# Patient Record
Sex: Male | Born: 1950 | Race: Black or African American | Hispanic: No | Marital: Single
Health system: Southern US, Community
[De-identification: ages and names within clinical notes are randomized; demographics above are authoritative.]

## PROBLEM LIST (undated history)

## (undated) DIAGNOSIS — J449 Chronic obstructive pulmonary disease, unspecified: Secondary | ICD-10-CM

---

## 2009-02-03 ENCOUNTER — Inpatient Hospital Stay (HOSPITAL_COMMUNITY): Admission: EM | Admit: 2009-02-03 | Discharge: 2009-02-15 | Payer: Self-pay | Admitting: Emergency Medicine

## 2009-02-03 ENCOUNTER — Ambulatory Visit: Payer: Self-pay | Admitting: Emergency Medicine

## 2009-02-03 ENCOUNTER — Ambulatory Visit: Payer: Self-pay | Admitting: Cardiology

## 2009-02-04 ENCOUNTER — Ambulatory Visit: Payer: Self-pay | Admitting: Vascular Surgery

## 2009-02-04 ENCOUNTER — Encounter: Payer: Self-pay | Admitting: Emergency Medicine

## 2009-02-04 ENCOUNTER — Encounter (INDEPENDENT_AMBULATORY_CARE_PROVIDER_SITE_OTHER): Payer: Self-pay | Admitting: General Surgery

## 2009-02-11 ENCOUNTER — Ambulatory Visit: Payer: Self-pay | Admitting: Physical Medicine & Rehabilitation

## 2009-02-16 ENCOUNTER — Inpatient Hospital Stay (HOSPITAL_COMMUNITY): Admission: EM | Admit: 2009-02-16 | Discharge: 2009-03-01 | Payer: Self-pay | Admitting: Emergency Medicine

## 2009-02-16 ENCOUNTER — Ambulatory Visit: Payer: Self-pay | Admitting: Cardiovascular Disease

## 2009-02-19 ENCOUNTER — Encounter (INDEPENDENT_AMBULATORY_CARE_PROVIDER_SITE_OTHER): Payer: Self-pay | Admitting: Internal Medicine

## 2009-02-19 ENCOUNTER — Encounter: Payer: Self-pay | Admitting: Internal Medicine

## 2009-04-05 ENCOUNTER — Encounter: Payer: Self-pay | Admitting: Cardiology

## 2009-04-08 ENCOUNTER — Ambulatory Visit: Payer: Self-pay | Admitting: Cardiology

## 2009-04-08 ENCOUNTER — Encounter (INDEPENDENT_AMBULATORY_CARE_PROVIDER_SITE_OTHER): Payer: Self-pay | Admitting: *Deleted

## 2009-04-08 ENCOUNTER — Ambulatory Visit: Payer: Self-pay | Admitting: Surgery

## 2009-04-08 DIAGNOSIS — I10 Essential (primary) hypertension: Secondary | ICD-10-CM

## 2009-04-08 DIAGNOSIS — E785 Hyperlipidemia, unspecified: Secondary | ICD-10-CM | POA: Insufficient documentation

## 2009-04-08 DIAGNOSIS — I2510 Atherosclerotic heart disease of native coronary artery without angina pectoris: Secondary | ICD-10-CM | POA: Insufficient documentation

## 2009-04-08 DIAGNOSIS — I5022 Chronic systolic (congestive) heart failure: Secondary | ICD-10-CM

## 2009-04-18 LAB — CONVERTED CEMR LAB
BUN: 35 mg/dL — ABNORMAL HIGH (ref 6–23)
Basophils Absolute: 0.1 10*3/uL (ref 0.0–0.1)
Calcium: 9 mg/dL (ref 8.4–10.5)
Chloride: 101 meq/L (ref 96–112)
Creatinine, Ser: 1.7 mg/dL — ABNORMAL HIGH (ref 0.4–1.5)
HCT: 40.9 % (ref 39.0–52.0)
Lymphs Abs: 3 10*3/uL (ref 0.7–4.0)
Monocytes Absolute: 0.6 10*3/uL (ref 0.1–1.0)
Monocytes Relative: 6.3 % (ref 3.0–12.0)
Platelets: 209 10*3/uL (ref 150.0–400.0)
Pro B Natriuretic peptide (BNP): 108 pg/mL — ABNORMAL HIGH (ref 0.0–100.0)
RDW: 18.3 % — ABNORMAL HIGH (ref 11.5–14.6)

## 2009-06-04 ENCOUNTER — Encounter: Payer: Self-pay | Admitting: Cardiology

## 2009-06-10 ENCOUNTER — Ambulatory Visit: Payer: Self-pay | Admitting: Surgery

## 2009-07-16 ENCOUNTER — Ambulatory Visit: Payer: Self-pay | Admitting: Vascular Surgery

## 2009-07-25 ENCOUNTER — Encounter (INDEPENDENT_AMBULATORY_CARE_PROVIDER_SITE_OTHER): Payer: Self-pay | Admitting: *Deleted

## 2009-07-29 ENCOUNTER — Ambulatory Visit: Payer: Self-pay | Admitting: Vascular Surgery

## 2009-08-05 ENCOUNTER — Ambulatory Visit: Payer: Self-pay | Admitting: Vascular Surgery

## 2009-09-02 ENCOUNTER — Ambulatory Visit: Payer: Self-pay | Admitting: Vascular Surgery

## 2009-09-09 ENCOUNTER — Ambulatory Visit: Payer: Self-pay | Admitting: Vascular Surgery

## 2010-03-25 NOTE — Letter (Signed)
Summary: Med List  Med List   Imported By: Marylou Mccoy 06/06/2009 16:58:31  _____________________________________________________________________  External Attachment:    Type:   Image     Comment:   External Document

## 2010-03-25 NOTE — Letter (Signed)
Summary: [facility] - Disability Determination S  [facility] [facility] - Disability Determination Services   Imported By: Marylou Mccoy 08/13/2009 11:32:37  _____________________________________________________________________  External Attachment:    Type:   Image     Comment:   External Document

## 2010-03-25 NOTE — Letter (Signed)
Summary: Generic Letter  Architectural technologist, Main Office  [address]   [place], [place] [address]   Phone: [phone]  Fax: [phone]        April 08, 2009 MRN: [id number]    IBAN UTZ [address] [place], [place]  [address]    To Whom It May Concern:  Mr. Borg may perform light duty at [employer]. No lifting greater than 15-20 lbs. No climbing or stooping (activities like sweeping are ok). If you have any questions, please contact us at [phone].         Sincerely,  Charlies Constable, MD Sherri Rad, RN, BSN  This letter has been electronically signed by your physician.

## 2010-03-25 NOTE — Assessment & Plan Note (Signed)
Summary: eph per night message/lg   Visit Type:  Initial Consult-eph Primary Provider:  none-  CC:  sob and chest pain.  History of Present Illness: The patient is 60 years old and returned for management of CAD and CHF. He is a Naval architect. [place] He was admitted on December 10 with cellulitis and congestive heart failure. While in the hospital he had a DMI with ST elevation which resolved and he suddenly had catheterization and a bare metal stent placed to the distal right coronary. His permanent wedge pressure was 34 at the time of catheterization this point we are pressure was 71/35 with a mean of 51. His ejection fraction was 40%. He finally improved and was discharged to a nursing home where he had the therapy. He was discharged from the nursing home to the [facility] about 5 days ago. This is a house for homeless veterans. His son lives in [place] but apparently he's not planning to live with his son.  He has done fairly well since his discharge from the hospital but no shortness of breath or chest pain. He is about ready to run out of his medicines but he plans to go to the [facility] next week where he held to obtain his medicines. He also hopes to obtain VA benefits of that he can one daily for several house and pay rent in a private apartment.  Current Medications (verified): 1)  Amlodipine Besylate 10 Mg Tabs (Amlodipine Besylate) .... Take One Tablet By Mouth Daily 2)  Aspirin Ec 325 Mg Tbec (Aspirin) .... Take One Tablet By Mouth Daily 3)  Benazepril Hcl 5 Mg Tabs (Benazepril Hcl) .Marland Kitchen.. 1 Tab Once Daily 4)  Coreg 6.25 Mg Tabs (Carvedilol) .Marland Kitchen.. 1 Tab Two Times A Day 5)  Plavix 75 Mg Tabs (Clopidogrel Bisulfate) .Marland Kitchen.. 1 Tab Once Daily 6)  Lasix 80 Mg Tabs (Furosemide) .... One Tab By Mouth Two Times A Day 7)  Simvastatin 80 Mg Tabs (Simvastatin) .... Take One Tablet By Mouth Daily At Bedtime 8)  Roxicodone 5 Mg Tabs (Oxycodone Hcl) .Marland Kitchen.. 1 Tab Once  Daily As Needed- Shoulder 9)  Glipizide 10 Mg Xr24h-Tab (Glipizide) .Marland Kitchen.. 1 Tab Once Daily 10)  Terbinafine Hcl 250 Mg Tabs (Terbinafine Hcl) .... (Lamsil) As Directed 11)  Nitrostat 0.4 Mg Subl (Nitroglycerin) .Marland Kitchen.. 1 Tablet Under Tongue At Onset of Chest Pain; You May Repeat Every 5 Minutes For Up To 3 Doses.  Allergies (verified): No Known Drug Allergies  Past History:  Past Medical History: hypertension Hyperlipidemia Diabetes Chronic renal insufficiency with creatinines in the range of 1.5 Systolic heart failure CAD status post stenting of the right coronary following a non-S. elevation MI 12/10 Number history of cellulitis of the lower extremities  Review of Systems       ROS is negative except as outlined in HPI.   Vital Signs:  Patient profile:   61 year old male Height:      69 inches Weight:      218 pounds BMI:     32.31 BP sitting:   127 / 73  (left arm) Cuff size:   large  Vitals Entered By: Burnett Kanaris, CNA (April 08, 2009 3:36 PM)  Physical Exam  Additional Exam:  Gen. Well-nourished, in no distress   Neck: No JVD, thyroid not enlarged, no carotid bruits Lungs: No tachypnea, clear without rales, rhonchi or wheezes Cardiovascular: Rhythm regular, PMI not displaced,  heart sounds  normal, no  murmurs or gallops, no peripheral edema, pulses normal in all 4 extremities. Abdomen: BS normal, abdomen soft and non-tender without masses or organomegaly, no hepatosplenomegaly. MS: No deformities, no cyanosis or clubbing   Neuro:  No focal sns   Skin:  no lesions    Impression & Recommendations:  Problem # 1:  SYSTOLIC HEART FAILURE, CHRONIC (ICD-428.22)  He was recently hospitalized for systolic heart failure with an ejection fraction of 40% and severe pulmonary hypertension. He appears much better and appears euvolemic today. We will continue current medications. His updated medication list for this problem includes:    Amlodipine Besylate 10 Mg Tabs  (Amlodipine besylate) .Marland Kitchen... Take one tablet by mouth daily    Aspirin Ec 325 Mg Tbec (Aspirin) .Marland Kitchen... Take one tablet by mouth daily    Benazepril Hcl 5 Mg Tabs (Benazepril hcl) .Marland Kitchen... 1 tab once daily    Coreg 6.25 Mg Tabs (Carvedilol) .Marland Kitchen... 1 tab two times a day    Plavix 75 Mg Tabs (Clopidogrel bisulfate) .Marland Kitchen... 1 tab once daily    Lasix 80 Mg Tabs (Furosemide) ..... One tab by mouth two times a day    Nitrostat 0.4 Mg Subl (Nitroglycerin) .Marland Kitchen... 1 tablet under tongue at onset of chest pain; you may repeat every 5 minutes for up to 3 doses.  His updated medication list for this problem includes:    Amlodipine Besylate 10 Mg Tabs (Amlodipine besylate) .Marland Kitchen... Take one tablet by mouth daily    Aspirin Ec 325 Mg Tbec (Aspirin) .Marland Kitchen... Take one tablet by mouth daily    Benazepril Hcl 5 Mg Tabs (Benazepril hcl) .Marland Kitchen... 1 tab once daily    Coreg 6.25 Mg Tabs (Carvedilol) .Marland Kitchen... 1 tab two times a day    Plavix 75 Mg Tabs (Clopidogrel bisulfate) .Marland Kitchen... 1 tab once daily    Lasix 80 Mg Tabs (Furosemide) ..... One tab by mouth two times a day    Nitrostat 0.4 Mg Subl (Nitroglycerin) .Marland Kitchen... 1 tablet under tongue at onset of chest pain; you may repeat every 5 minutes for up to 3 doses.  Orders: TLB-BMP (Basic Metabolic Panel-BMET) (80048-METABOL) TLB-BNP (B-Natriuretic Peptide) (83880-BNPR) TLB-CBC Platelet - w/Differential (85025-CBCD) EKG w/ Interpretation (93000)  Problem # 2:  CAD, NATIVE VESSEL (ICD-414.01)  He had a DMI with transient ST elevation which resolved and he later had a bare-metal stent placed to the distal right coronary. He is had no recurrent chest pain in this problem appears stable. His updated medication list for this problem includes:    Amlodipine Besylate 10 Mg Tabs (Amlodipine besylate) .Marland Kitchen... Take one tablet by mouth daily    Aspirin Ec 325 Mg Tbec (Aspirin) .Marland Kitchen... Take one tablet by mouth daily    Benazepril Hcl 5 Mg Tabs (Benazepril hcl) .Marland Kitchen... 1 tab once daily    Coreg 6.25 Mg Tabs  (Carvedilol) .Marland Kitchen... 1 tab two times a day    Plavix 75 Mg Tabs (Clopidogrel bisulfate) .Marland Kitchen... 1 tab once daily    Nitrostat 0.4 Mg Subl (Nitroglycerin) .Marland Kitchen... 1 tablet under tongue at onset of chest pain; you may repeat every 5 minutes for up to 3 doses.  Orders: TLB-BMP (Basic Metabolic Panel-BMET) (80048-METABOL) TLB-BNP (B-Natriuretic Peptide) (83880-BNPR) TLB-CBC Platelet - w/Differential (85025-CBCD) EKG w/ Interpretation (93000)  Problem # 3:  HYPERTENSION, BENIGN (ICD-401.1) This appears controlled on current medications. His updated medication list for this problem includes:    Amlodipine Besylate 10 Mg Tabs (Amlodipine besylate) .Marland Kitchen... Take one tablet by mouth daily    Aspirin  Ec 325 Mg Tbec (Aspirin) .Marland Kitchen... Take one tablet by mouth daily    Benazepril Hcl 5 Mg Tabs (Benazepril hcl) .Marland Kitchen... 1 tab once daily    Coreg 6.25 Mg Tabs (Carvedilol) .Marland Kitchen... 1 tab two times a day    Lasix 80 Mg Tabs (Furosemide) ..... One tab by mouth two times a day  His updated medication list for this problem includes:    Amlodipine Besylate 10 Mg Tabs (Amlodipine besylate) .Marland Kitchen... Take one tablet by mouth daily    Aspirin Ec 325 Mg Tbec (Aspirin) .Marland Kitchen... Take one tablet by mouth daily    Benazepril Hcl 5 Mg Tabs (Benazepril hcl) .Marland Kitchen... 1 tab once daily    Coreg 6.25 Mg Tabs (Carvedilol) .Marland Kitchen... 1 tab two times a day    Lasix 80 Mg Tabs (Furosemide) ..... One tab by mouth two times a day  Problem # 4:  HYPERLIPIDEMIA-MIXED (ICD-272.4) This is currently being managed with simvastatin. His updated medication list for this problem includes:    Simvastatin 80 Mg Tabs (Simvastatin) .Marland Kitchen... Take one tablet by mouth daily at bedtime  His updated medication list for this problem includes:    Simvastatin 80 Mg Tabs (Simvastatin) .Marland Kitchen... Take one tablet by mouth daily at bedtime  Patient Instructions: 1)  Your physician recommends that you schedule a follow-up appointment in: 3 months. 2)  You have been given 90-day  prescriptions for all of your cardiac medications to take to the [place]. 3)  You may perform light duty at the [facility]. No lifting greater than 15-20 lbs. No climbing or stooping (things like sweeping are ok). 4)  Your physician recommends that you have lab work today: bmet/bnp/cbc (428.0;414.01) Prescriptions: NITROSTAT 0.4 MG SUBL (NITROGLYCERIN) 1 tablet under tongue at onset of chest pain; you may repeat every 5 minutes for up to 3 doses.  #25 x 3   Entered by:   Sherri Rad, RN, BSN   Authorized by:   Lenoria Farrier, MD, [facility]   Signed by:   Sherri Rad, RN, BSN on 04/08/2009   Method used:   Print then Give to Patient   RxID:   [id number] SIMVASTATIN 80 MG TABS (SIMVASTATIN) Take one tablet by mouth daily at bedtime  #90 x 3   Entered by:   Sherri Rad, RN, BSN   Authorized by:   Lenoria Farrier, MD, [facility]   Signed by:   Sherri Rad, RN, BSN on 04/08/2009   Method used:   Print then Give to Patient   RxID:   [id number] LASIX 80 MG TABS (FUROSEMIDE) one tab by mouth two times a day  #180 x 3   Entered by:   Sherri Rad, RN, BSN   Authorized by:   Lenoria Farrier, MD, [facility]   Signed by:   Sherri Rad, RN, BSN on 04/08/2009   Method used:   Print then Give to Patient   RxID:   [id number] PLAVIX 75 MG TABS (CLOPIDOGREL BISULFATE) 1 tab once daily  #90 x 3   Entered by:   Sherri Rad, RN, BSN   Authorized by:   Lenoria Farrier, MD, [facility]   Signed by:   Sherri Rad, RN, BSN on 04/08/2009   Method used:   Print then Give to Patient   RxID:   [id number] COREG 6.25 MG TABS (CARVEDILOL) 1 tab two times a day  #180 x 3   Entered by:   Sherri Rad, RN, BSN   Authorized by:   Hart Rochester  Juanda Chance, MD, [facility]   Signed by:   Sherri Rad, RN, BSN on 04/08/2009   Method used:   Print then Give to Patient   RxID:   [id number] BENAZEPRIL HCL 5 MG TABS (BENAZEPRIL HCL) 1 tab once daily  #90 x 3   Entered by:   Sherri Rad, RN, BSN   Authorized by:   Lenoria Farrier, MD, [facility]   Signed by:   Sherri Rad, RN, BSN on 04/08/2009   Method used:   Print then Give to Patient   RxID:   [id number] AMLODIPINE BESYLATE 10 MG TABS (AMLODIPINE BESYLATE) Take one tablet by mouth daily  #90 x 3   Entered by:   Sherri Rad, RN, BSN   Authorized by:   Lenoria Farrier, MD, [facility]   Signed by:   Sherri Rad, RN, BSN on 04/08/2009   Method used:   Print then Give to Patient   RxID:   [id number]

## 2010-03-25 NOTE — Consult Note (Signed)
Summary: [facility]   Imported By: Roderic Ovens 03/06/2009 16:15:11  _____________________________________________________________________  External Attachment:    Type:   Image     Comment:   External Document

## 2010-03-25 NOTE — Letter (Signed)
Summary: Appointment - Missed  [facility], Main Office  [address]   [place], [place] [address]   Phone: [phone]  Fax: [phone]     July 25, 2009 MRN: [id number]   BENNETT RAM [address] [place], [place]  [address]   Dear Mr. Mesmer,  Our records indicate you missed your appointment on 07/02/2009 with Dr. Juanda Chance. It is very important that we reach you to reschedule this appointment. We look forward to participating in your health care needs. Please contact us at the number listed above at your earliest convenience to reschedule this appointment.     Sincerely, Neurosurgeon Team LG

## 2010-05-11 LAB — BASIC METABOLIC PANEL
BUN: 25 mg/dL — ABNORMAL HIGH (ref 6–23)
BUN: 27 mg/dL — ABNORMAL HIGH (ref 6–23)
BUN: 30 mg/dL — ABNORMAL HIGH (ref 6–23)
CO2: 35 mEq/L — ABNORMAL HIGH (ref 19–32)
Chloride: 98 mEq/L (ref 96–112)
Chloride: 99 mEq/L (ref 96–112)
Creatinine, Ser: 1.44 mg/dL (ref 0.4–1.5)
GFR calc Af Amer: 56 mL/min — ABNORMAL LOW (ref >60)
GFR calc Af Amer: >60 mL/min (ref >60)
GFR calc non Af Amer: 40 mL/min — ABNORMAL LOW (ref >60)
GFR calc non Af Amer: 46 mL/min — ABNORMAL LOW (ref >60)
GFR calc non Af Amer: 50 mL/min — ABNORMAL LOW (ref >60)
Glucose, Bld: 211 mg/dL — ABNORMAL HIGH (ref 70–99)
Potassium: 4 mEq/L (ref 3.5–5.1)
Potassium: 4.3 mEq/L (ref 3.5–5.1)
Sodium: 138 mEq/L (ref 135–145)

## 2010-05-11 LAB — GLUCOSE, CAPILLARY
Glucose-Capillary: 109 mg/dL — ABNORMAL HIGH (ref 70–99)
Glucose-Capillary: 117 mg/dL — ABNORMAL HIGH (ref 70–99)
Glucose-Capillary: 126 mg/dL — ABNORMAL HIGH (ref 70–99)
Glucose-Capillary: 128 mg/dL — ABNORMAL HIGH (ref 70–99)
Glucose-Capillary: 131 mg/dL — ABNORMAL HIGH (ref 70–99)
Glucose-Capillary: 134 mg/dL — ABNORMAL HIGH (ref 70–99)
Glucose-Capillary: 140 mg/dL — ABNORMAL HIGH (ref 70–99)
Glucose-Capillary: 141 mg/dL — ABNORMAL HIGH (ref 70–99)
Glucose-Capillary: 144 mg/dL — ABNORMAL HIGH (ref 70–99)
Glucose-Capillary: 152 mg/dL — ABNORMAL HIGH (ref 70–99)
Glucose-Capillary: 157 mg/dL — ABNORMAL HIGH (ref 70–99)
Glucose-Capillary: 162 mg/dL — ABNORMAL HIGH (ref 70–99)
Glucose-Capillary: 166 mg/dL — ABNORMAL HIGH (ref 70–99)

## 2010-05-26 LAB — URINE MICROSCOPIC-ADD ON

## 2010-05-26 LAB — BASIC METABOLIC PANEL
BUN: 17 mg/dL (ref 6–23)
BUN: 20 mg/dL (ref 6–23)
BUN: 24 mg/dL — ABNORMAL HIGH (ref 6–23)
BUN: 27 mg/dL — ABNORMAL HIGH (ref 6–23)
BUN: 49 mg/dL — ABNORMAL HIGH (ref 6–23)
CO2: 29 mEq/L (ref 19–32)
CO2: 32 mEq/L (ref 19–32)
CO2: 34 mEq/L — ABNORMAL HIGH (ref 19–32)
CO2: 35 mEq/L — ABNORMAL HIGH (ref 19–32)
CO2: 36 mEq/L — ABNORMAL HIGH (ref 19–32)
CO2: 36 mEq/L — ABNORMAL HIGH (ref 19–32)
Calcium: 7.7 mg/dL — ABNORMAL LOW (ref 8.4–10.5)
Calcium: 8 mg/dL — ABNORMAL LOW (ref 8.4–10.5)
Calcium: 8.1 mg/dL — ABNORMAL LOW (ref 8.4–10.5)
Calcium: 8.1 mg/dL — ABNORMAL LOW (ref 8.4–10.5)
Calcium: 8.3 mg/dL — ABNORMAL LOW (ref 8.4–10.5)
Chloride: 104 mEq/L (ref 96–112)
Chloride: 108 mEq/L (ref 96–112)
Chloride: 112 mEq/L (ref 96–112)
Chloride: 96 mEq/L (ref 96–112)
Chloride: 98 mEq/L (ref 96–112)
Creatinine, Ser: 1.45 mg/dL (ref 0.4–1.5)
Creatinine, Ser: 1.57 mg/dL — ABNORMAL HIGH (ref 0.4–1.5)
Creatinine, Ser: 2.13 mg/dL — ABNORMAL HIGH (ref 0.4–1.5)
Creatinine, Ser: 2.84 mg/dL — ABNORMAL HIGH (ref 0.4–1.5)
GFR calc Af Amer: 28 mL/min — ABNORMAL LOW (ref >60)
GFR calc Af Amer: 39 mL/min — ABNORMAL LOW (ref >60)
GFR calc Af Amer: 50 mL/min — ABNORMAL LOW (ref >60)
GFR calc Af Amer: 55 mL/min — ABNORMAL LOW (ref >60)
GFR calc Af Amer: 56 mL/min — ABNORMAL LOW (ref >60)
GFR calc Af Amer: 57 mL/min — ABNORMAL LOW (ref >60)
GFR calc Af Amer: 57 mL/min — ABNORMAL LOW (ref >60)
GFR calc Af Amer: 58 mL/min — ABNORMAL LOW (ref >60)
GFR calc Af Amer: 60 mL/min — ABNORMAL LOW (ref >60)
GFR calc Af Amer: >60 mL/min (ref >60)
GFR calc non Af Amer: 41 mL/min — ABNORMAL LOW (ref >60)
GFR calc non Af Amer: 46 mL/min — ABNORMAL LOW (ref >60)
GFR calc non Af Amer: 47 mL/min — ABNORMAL LOW (ref >60)
GFR calc non Af Amer: 47 mL/min — ABNORMAL LOW (ref >60)
GFR calc non Af Amer: 48 mL/min — ABNORMAL LOW (ref >60)
GFR calc non Af Amer: 57 mL/min — ABNORMAL LOW (ref >60)
Glucose, Bld: 110 mg/dL — ABNORMAL HIGH (ref 70–99)
Glucose, Bld: 152 mg/dL — ABNORMAL HIGH (ref 70–99)
Glucose, Bld: 186 mg/dL — ABNORMAL HIGH (ref 70–99)
Glucose, Bld: 216 mg/dL — ABNORMAL HIGH (ref 70–99)
Potassium: 3.4 mEq/L — ABNORMAL LOW (ref 3.5–5.1)
Potassium: 3.6 mEq/L (ref 3.5–5.1)
Potassium: 3.6 mEq/L (ref 3.5–5.1)
Potassium: 4.2 mEq/L (ref 3.5–5.1)
Potassium: 4.2 mEq/L (ref 3.5–5.1)
Potassium: 4.2 mEq/L (ref 3.5–5.1)
Potassium: 4.2 mEq/L (ref 3.5–5.1)
Potassium: 4.3 mEq/L (ref 3.5–5.1)
Potassium: 4.4 mEq/L (ref 3.5–5.1)
Sodium: 137 mEq/L (ref 135–145)
Sodium: 137 mEq/L (ref 135–145)
Sodium: 139 mEq/L (ref 135–145)
Sodium: 139 mEq/L (ref 135–145)
Sodium: 144 mEq/L (ref 135–145)
Sodium: 150 mEq/L — ABNORMAL HIGH (ref 135–145)
Sodium: 151 mEq/L — ABNORMAL HIGH (ref 135–145)
Sodium: 151 mEq/L — ABNORMAL HIGH (ref 135–145)

## 2010-05-26 LAB — CBC
HCT: 38.4 % — ABNORMAL LOW (ref 39.0–52.0)
HCT: 41 % (ref 39.0–52.0)
HCT: 41.2 % (ref 39.0–52.0)
HCT: 42.4 % (ref 39.0–52.0)
HCT: 43.4 % (ref 39.0–52.0)
HCT: 43.4 % (ref 39.0–52.0)
HCT: 44 % (ref 39.0–52.0)
Hemoglobin: 11.9 g/dL — ABNORMAL LOW (ref 13.0–17.0)
Hemoglobin: 12.4 g/dL — ABNORMAL LOW (ref 13.0–17.0)
Hemoglobin: 13.1 g/dL (ref 13.0–17.0)
Hemoglobin: 13.3 g/dL (ref 13.0–17.0)
Hemoglobin: 13.6 g/dL (ref 13.0–17.0)
Hemoglobin: 13.9 g/dL (ref 13.0–17.0)
MCHC: 31.1 g/dL (ref 30.0–36.0)
MCHC: 31.2 g/dL (ref 30.0–36.0)
MCHC: 31.3 g/dL (ref 30.0–36.0)
MCHC: 31.8 g/dL (ref 30.0–36.0)
MCHC: 32 g/dL (ref 30.0–36.0)
MCHC: 32 g/dL (ref 30.0–36.0)
MCHC: 32.4 g/dL (ref 30.0–36.0)
MCHC: 32.5 g/dL (ref 30.0–36.0)
MCHC: 32.5 g/dL (ref 30.0–36.0)
MCV: 81.7 fL (ref 78.0–100.0)
MCV: 82.1 fL (ref 78.0–100.0)
MCV: 82.6 fL (ref 78.0–100.0)
MCV: 82.8 fL (ref 78.0–100.0)
MCV: 83.5 fL (ref 78.0–100.0)
MCV: 84 fL (ref 78.0–100.0)
MCV: 84.1 fL (ref 78.0–100.0)
Platelets: 159 10*3/uL (ref 150–400)
Platelets: 181 10*3/uL (ref 150–400)
Platelets: 183 10*3/uL (ref 150–400)
Platelets: 190 10*3/uL (ref 150–400)
Platelets: 191 10*3/uL (ref 150–400)
Platelets: 192 10*3/uL (ref 150–400)
Platelets: 198 10*3/uL (ref 150–400)
Platelets: 198 10*3/uL (ref 150–400)
Platelets: 232 10*3/uL (ref 150–400)
RBC: 4.94 MIL/uL (ref 4.22–5.81)
RBC: 5.01 MIL/uL (ref 4.22–5.81)
RBC: 5.21 MIL/uL (ref 4.22–5.81)
RBC: 5.23 MIL/uL (ref 4.22–5.81)
RBC: 5.26 MIL/uL (ref 4.22–5.81)
RBC: 5.61 MIL/uL (ref 4.22–5.81)
RBC: 5.68 MIL/uL (ref 4.22–5.81)
RDW: 19.9 % — ABNORMAL HIGH (ref 11.5–15.5)
RDW: 20 % — ABNORMAL HIGH (ref 11.5–15.5)
RDW: 20.5 % — ABNORMAL HIGH (ref 11.5–15.5)
RDW: 20.9 % — ABNORMAL HIGH (ref 11.5–15.5)
WBC: 11.1 10*3/uL — ABNORMAL HIGH (ref 4.0–10.5)
WBC: 13.3 10*3/uL — ABNORMAL HIGH (ref 4.0–10.5)
WBC: 13.7 10*3/uL — ABNORMAL HIGH (ref 4.0–10.5)
WBC: 13.7 10*3/uL — ABNORMAL HIGH (ref 4.0–10.5)
WBC: 15.9 10*3/uL — ABNORMAL HIGH (ref 4.0–10.5)
WBC: 16.5 10*3/uL — ABNORMAL HIGH (ref 4.0–10.5)
WBC: 8.9 10*3/uL (ref 4.0–10.5)

## 2010-05-26 LAB — CARDIAC PANEL(CRET KIN+CKTOT+MB+TROPI)
CK, MB: 10.9 ng/mL — ABNORMAL HIGH (ref 0.3–4.0)
CK, MB: 14.6 ng/mL — ABNORMAL HIGH (ref 0.3–4.0)
Relative Index: 13.6 — ABNORMAL HIGH (ref 0.0–2.5)
Relative Index: 15 — ABNORMAL HIGH (ref 0.0–2.5)
Relative Index: INVALID (ref 0.0–2.5)
Total CK: 107 U/L (ref 7–232)
Total CK: 47 U/L (ref 7–232)
Total CK: 83 U/L (ref 7–232)
Troponin I: 0.04 ng/mL (ref 0.00–0.06)
Troponin I: 2.55 ng/mL (ref 0.00–0.06)
Troponin I: 2.64 ng/mL (ref 0.00–0.06)

## 2010-05-26 LAB — LIPID PANEL
Cholesterol: 109 mg/dL (ref 0–200)
LDL Cholesterol: 65 mg/dL (ref 0–99)
Total CHOL/HDL Ratio: 5 RATIO
Triglycerides: 111 mg/dL (ref <150)
VLDL: 22 mg/dL (ref 0–40)

## 2010-05-26 LAB — GLUCOSE, CAPILLARY
Glucose-Capillary: 100 mg/dL — ABNORMAL HIGH (ref 70–99)
Glucose-Capillary: 100 mg/dL — ABNORMAL HIGH (ref 70–99)
Glucose-Capillary: 101 mg/dL — ABNORMAL HIGH (ref 70–99)
Glucose-Capillary: 105 mg/dL — ABNORMAL HIGH (ref 70–99)
Glucose-Capillary: 119 mg/dL — ABNORMAL HIGH (ref 70–99)
Glucose-Capillary: 127 mg/dL — ABNORMAL HIGH (ref 70–99)
Glucose-Capillary: 131 mg/dL — ABNORMAL HIGH (ref 70–99)
Glucose-Capillary: 131 mg/dL — ABNORMAL HIGH (ref 70–99)
Glucose-Capillary: 133 mg/dL — ABNORMAL HIGH (ref 70–99)
Glucose-Capillary: 135 mg/dL — ABNORMAL HIGH (ref 70–99)
Glucose-Capillary: 135 mg/dL — ABNORMAL HIGH (ref 70–99)
Glucose-Capillary: 136 mg/dL — ABNORMAL HIGH (ref 70–99)
Glucose-Capillary: 139 mg/dL — ABNORMAL HIGH (ref 70–99)
Glucose-Capillary: 141 mg/dL — ABNORMAL HIGH (ref 70–99)
Glucose-Capillary: 141 mg/dL — ABNORMAL HIGH (ref 70–99)
Glucose-Capillary: 143 mg/dL — ABNORMAL HIGH (ref 70–99)
Glucose-Capillary: 143 mg/dL — ABNORMAL HIGH (ref 70–99)
Glucose-Capillary: 146 mg/dL — ABNORMAL HIGH (ref 70–99)
Glucose-Capillary: 147 mg/dL — ABNORMAL HIGH (ref 70–99)
Glucose-Capillary: 148 mg/dL — ABNORMAL HIGH (ref 70–99)
Glucose-Capillary: 150 mg/dL — ABNORMAL HIGH (ref 70–99)
Glucose-Capillary: 150 mg/dL — ABNORMAL HIGH (ref 70–99)
Glucose-Capillary: 154 mg/dL — ABNORMAL HIGH (ref 70–99)
Glucose-Capillary: 155 mg/dL — ABNORMAL HIGH (ref 70–99)
Glucose-Capillary: 156 mg/dL — ABNORMAL HIGH (ref 70–99)
Glucose-Capillary: 157 mg/dL — ABNORMAL HIGH (ref 70–99)
Glucose-Capillary: 159 mg/dL — ABNORMAL HIGH (ref 70–99)
Glucose-Capillary: 161 mg/dL — ABNORMAL HIGH (ref 70–99)
Glucose-Capillary: 161 mg/dL — ABNORMAL HIGH (ref 70–99)
Glucose-Capillary: 162 mg/dL — ABNORMAL HIGH (ref 70–99)
Glucose-Capillary: 163 mg/dL — ABNORMAL HIGH (ref 70–99)
Glucose-Capillary: 170 mg/dL — ABNORMAL HIGH (ref 70–99)
Glucose-Capillary: 172 mg/dL — ABNORMAL HIGH (ref 70–99)
Glucose-Capillary: 180 mg/dL — ABNORMAL HIGH (ref 70–99)
Glucose-Capillary: 188 mg/dL — ABNORMAL HIGH (ref 70–99)
Glucose-Capillary: 189 mg/dL — ABNORMAL HIGH (ref 70–99)
Glucose-Capillary: 189 mg/dL — ABNORMAL HIGH (ref 70–99)
Glucose-Capillary: 203 mg/dL — ABNORMAL HIGH (ref 70–99)
Glucose-Capillary: 213 mg/dL — ABNORMAL HIGH (ref 70–99)

## 2010-05-26 LAB — COMPREHENSIVE METABOLIC PANEL
ALT: 32 U/L (ref 0–53)
AST: 19 U/L (ref 0–37)
Albumin: 1.6 g/dL — ABNORMAL LOW (ref 3.5–5.2)
BUN: 26 mg/dL — ABNORMAL HIGH (ref 6–23)
CO2: 37 mEq/L — ABNORMAL HIGH (ref 19–32)
Calcium: 8.4 mg/dL (ref 8.4–10.5)
Calcium: 8.6 mg/dL (ref 8.4–10.5)
Creatinine, Ser: 1.53 mg/dL — ABNORMAL HIGH (ref 0.4–1.5)
GFR calc Af Amer: 56 mL/min — ABNORMAL LOW (ref >60)
GFR calc non Af Amer: 52 mL/min — ABNORMAL LOW (ref >60)
Glucose, Bld: 209 mg/dL — ABNORMAL HIGH (ref 70–99)
Sodium: 138 mEq/L (ref 135–145)
Total Protein: 6.7 g/dL (ref 6.0–8.3)

## 2010-05-26 LAB — DIFFERENTIAL
Basophils Absolute: 0 10*3/uL (ref 0.0–0.1)
Eosinophils Absolute: 0.1 10*3/uL (ref 0.0–0.7)
Eosinophils Relative: 1 % (ref 0–5)
Eosinophils Relative: 2 % (ref 0–5)
Lymphocytes Relative: 6 % — ABNORMAL LOW (ref 12–46)
Lymphocytes Relative: 9 % — ABNORMAL LOW (ref 12–46)
Lymphs Abs: 1.3 10*3/uL (ref 0.7–4.0)
Monocytes Absolute: 0.4 10*3/uL (ref 0.1–1.0)
Monocytes Absolute: 0.9 10*3/uL (ref 0.1–1.0)
Monocytes Relative: 4 % (ref 3–12)
Neutro Abs: 11.3 10*3/uL — ABNORMAL HIGH (ref 1.7–7.7)
Neutro Abs: 9.9 10*3/uL — ABNORMAL HIGH (ref 1.7–7.7)
Neutrophils Relative %: 89 % — ABNORMAL HIGH (ref 43–77)
nRBC: 0 /100 WBC

## 2010-05-26 LAB — POCT I-STAT, CHEM 8
Calcium, Ion: 1.09 mmol/L — ABNORMAL LOW (ref 1.12–1.32)
Chloride: 99 mEq/L (ref 96–112)
HCT: 47 % (ref 39.0–52.0)
Hemoglobin: 16 g/dL (ref 13.0–17.0)
TCO2: 44 mmol/L (ref 0–100)

## 2010-05-26 LAB — LIPASE, BLOOD: Lipase: 16 U/L (ref 11–59)

## 2010-05-26 LAB — URINALYSIS, ROUTINE W REFLEX MICROSCOPIC
Bilirubin Urine: NEGATIVE
Glucose, UA: NEGATIVE mg/dL
Glucose, UA: NEGATIVE mg/dL
Ketones, ur: NEGATIVE mg/dL
Protein, ur: 30 mg/dL — AB
Specific Gravity, Urine: 1.012 (ref 1.005–1.030)
Urobilinogen, UA: 0.2 mg/dL (ref 0.0–1.0)
pH: 6 (ref 5.0–8.0)

## 2010-05-26 LAB — CULTURE, BLOOD (ROUTINE X 2): Culture: NO GROWTH

## 2010-05-26 LAB — POCT I-STAT 3, ART BLOOD GAS (G3+)
Acid-Base Excess: 10 mmol/L — ABNORMAL HIGH (ref 0.0–2.0)
Acid-Base Excess: 8 mmol/L — ABNORMAL HIGH (ref 0.0–2.0)
Bicarbonate: 33.2 mEq/L — ABNORMAL HIGH (ref 20.0–24.0)
Bicarbonate: 36.8 mEq/L — ABNORMAL HIGH (ref 20.0–24.0)
O2 Saturation: 97 %
Patient temperature: 98.7
TCO2: 35 mmol/L (ref 0–100)
TCO2: 38 mmol/L (ref 0–100)
pO2, Arterial: 91 mmHg (ref 80.0–100.0)

## 2010-05-26 LAB — POCT I-STAT 3, VENOUS BLOOD GAS (G3P V)
Bicarbonate: 32.7 mEq/L — ABNORMAL HIGH (ref 20.0–24.0)
TCO2: 34 mmol/L (ref 0–100)
pCO2, Ven: 55.4 mmHg — ABNORMAL HIGH (ref 45.0–50.0)
pH, Ven: 7.379 — ABNORMAL HIGH (ref 7.250–7.300)
pO2, Ven: 35 mmHg (ref 30.0–45.0)

## 2010-05-26 LAB — URINE CULTURE: Colony Count: 15000

## 2010-05-26 LAB — PROTIME-INR
INR: 1.31 (ref 0.00–1.49)
Prothrombin Time: 16.2 seconds — ABNORMAL HIGH (ref 11.6–15.2)

## 2010-05-26 LAB — MAGNESIUM: Magnesium: 1.9 mg/dL (ref 1.5–2.5)

## 2010-05-26 LAB — BRAIN NATRIURETIC PEPTIDE: Pro B Natriuretic peptide (BNP): 637 pg/mL — ABNORMAL HIGH (ref 0.0–100.0)

## 2010-05-27 LAB — URINE MICROSCOPIC-ADD ON

## 2010-05-27 LAB — POCT I-STAT 7, (LYTES, BLD GAS, ICA,H+H)
Acid-Base Excess: 1 mmol/L (ref 0.0–2.0)
Calcium, Ion: 1.06 mmol/L — ABNORMAL LOW (ref 1.12–1.32)
O2 Saturation: 94 %
Patient temperature: 37
Potassium: 5.3 mEq/L — ABNORMAL HIGH (ref 3.5–5.1)
TCO2: 34 mmol/L (ref 0–100)
pCO2 arterial: 76.4 mmHg (ref 35.0–45.0)

## 2010-05-27 LAB — CBC
HCT: 45.8 % (ref 39.0–52.0)
Hemoglobin: 13.5 g/dL (ref 13.0–17.0)
Hemoglobin: 13.7 g/dL (ref 13.0–17.0)
Hemoglobin: 14.5 g/dL (ref 13.0–17.0)
MCHC: 31.3 g/dL (ref 30.0–36.0)
MCHC: 31.5 g/dL (ref 30.0–36.0)
MCHC: 32 g/dL (ref 30.0–36.0)
MCV: 83.8 fL (ref 78.0–100.0)
MCV: 84.9 fL (ref 78.0–100.0)
Platelets: 152 10*3/uL (ref 150–400)
Platelets: 154 10*3/uL (ref 150–400)
RBC: 5 MIL/uL (ref 4.22–5.81)
RBC: 5.13 MIL/uL (ref 4.22–5.81)
RBC: 5.14 MIL/uL (ref 4.22–5.81)
RBC: 5.84 MIL/uL — ABNORMAL HIGH (ref 4.22–5.81)
RDW: 20.4 % — ABNORMAL HIGH (ref 11.5–15.5)
RDW: 20.6 % — ABNORMAL HIGH (ref 11.5–15.5)
RDW: 20.9 % — ABNORMAL HIGH (ref 11.5–15.5)
WBC: 14.3 10*3/uL — ABNORMAL HIGH (ref 4.0–10.5)
WBC: 23.2 10*3/uL — ABNORMAL HIGH (ref 4.0–10.5)

## 2010-05-27 LAB — COMPREHENSIVE METABOLIC PANEL
ALT: 16 U/L (ref 0–53)
AST: 27 U/L (ref 0–37)
Albumin: 2.3 g/dL — ABNORMAL LOW (ref 3.5–5.2)
CO2: 26 mEq/L (ref 19–32)
CO2: 28 mEq/L (ref 19–32)
Calcium: 7.2 mg/dL — ABNORMAL LOW (ref 8.4–10.5)
Calcium: 7.2 mg/dL — ABNORMAL LOW (ref 8.4–10.5)
Creatinine, Ser: 4.16 mg/dL — ABNORMAL HIGH (ref 0.4–1.5)
GFR calc Af Amer: 18 mL/min — ABNORMAL LOW (ref >60)
GFR calc Af Amer: 35 mL/min — ABNORMAL LOW (ref >60)
GFR calc non Af Amer: 15 mL/min — ABNORMAL LOW (ref >60)
Glucose, Bld: 136 mg/dL — ABNORMAL HIGH (ref 70–99)
Sodium: 132 mEq/L — ABNORMAL LOW (ref 135–145)
Total Protein: 5.8 g/dL — ABNORMAL LOW (ref 6.0–8.3)
Total Protein: 6.7 g/dL (ref 6.0–8.3)

## 2010-05-27 LAB — DIFFERENTIAL
Basophils Absolute: 0 10*3/uL (ref 0.0–0.1)
Basophils Relative: 0 % (ref 0–1)
Eosinophils Absolute: 0 10*3/uL (ref 0.0–0.7)
Eosinophils Relative: 0 % (ref 0–5)
Lymphocytes Relative: 2 % — ABNORMAL LOW (ref 12–46)
Lymphs Abs: 0.2 10*3/uL — ABNORMAL LOW (ref 0.7–4.0)
Monocytes Relative: 3 % (ref 3–12)
Monocytes Relative: 4 % (ref 3–12)
Neutro Abs: 22.3 10*3/uL — ABNORMAL HIGH (ref 1.7–7.7)
Neutrophils Relative %: 94 % — ABNORMAL HIGH (ref 43–77)

## 2010-05-27 LAB — BASIC METABOLIC PANEL
BUN: 38 mg/dL — ABNORMAL HIGH (ref 6–23)
BUN: 41 mg/dL — ABNORMAL HIGH (ref 6–23)
CO2: 25 mEq/L (ref 19–32)
CO2: 27 mEq/L (ref 19–32)
CO2: 29 mEq/L (ref 19–32)
CO2: 30 mEq/L (ref 19–32)
Calcium: 6.8 mg/dL — ABNORMAL LOW (ref 8.4–10.5)
Calcium: 7 mg/dL — ABNORMAL LOW (ref 8.4–10.5)
Chloride: 100 mEq/L (ref 96–112)
Chloride: 99 mEq/L (ref 96–112)
Creatinine, Ser: 3.78 mg/dL — ABNORMAL HIGH (ref 0.4–1.5)
GFR calc Af Amer: 20 mL/min — ABNORMAL LOW (ref >60)
GFR calc Af Amer: 22 mL/min — ABNORMAL LOW (ref >60)
GFR calc non Af Amer: 16 mL/min — ABNORMAL LOW (ref >60)
GFR calc non Af Amer: 18 mL/min — ABNORMAL LOW (ref >60)
GFR calc non Af Amer: 20 mL/min — ABNORMAL LOW (ref >60)
GFR calc non Af Amer: 22 mL/min — ABNORMAL LOW (ref >60)
Glucose, Bld: 144 mg/dL — ABNORMAL HIGH (ref 70–99)
Glucose, Bld: 168 mg/dL — ABNORMAL HIGH (ref 70–99)
Potassium: 5.2 mEq/L — ABNORMAL HIGH (ref 3.5–5.1)
Potassium: 5.6 mEq/L — ABNORMAL HIGH (ref 3.5–5.1)
Sodium: 135 mEq/L (ref 135–145)
Sodium: 135 mEq/L (ref 135–145)
Sodium: 136 mEq/L (ref 135–145)

## 2010-05-27 LAB — URINALYSIS, ROUTINE W REFLEX MICROSCOPIC
Glucose, UA: NEGATIVE mg/dL
Ketones, ur: NEGATIVE mg/dL
Protein, ur: 100 mg/dL — AB

## 2010-05-27 LAB — POCT I-STAT 3, ART BLOOD GAS (G3+)
Acid-Base Excess: 4 mmol/L — ABNORMAL HIGH (ref 0.0–2.0)
Bicarbonate: 27.3 mEq/L — ABNORMAL HIGH (ref 20.0–24.0)
Bicarbonate: 28.1 mEq/L — ABNORMAL HIGH (ref 20.0–24.0)
Bicarbonate: 28.6 mEq/L — ABNORMAL HIGH (ref 20.0–24.0)
Bicarbonate: 32.9 mEq/L — ABNORMAL HIGH (ref 20.0–24.0)
O2 Saturation: 94 %
Patient temperature: 37.5
Patient temperature: 37.9
Patient temperature: 37.9
Patient temperature: 99
Patient temperature: 99
TCO2: 29 mmol/L (ref 0–100)
TCO2: 31 mmol/L (ref 0–100)
TCO2: 33 mmol/L (ref 0–100)
TCO2: 36 mmol/L (ref 0–100)
pCO2 arterial: 48.2 mmHg — ABNORMAL HIGH (ref 35.0–45.0)
pCO2 arterial: 66.1 mmHg (ref 35.0–45.0)
pH, Arterial: 7.255 — ABNORMAL LOW (ref 7.350–7.450)
pH, Arterial: 7.353 (ref 7.350–7.450)
pH, Arterial: 7.396 (ref 7.350–7.450)
pH, Arterial: 7.396 (ref 7.350–7.450)
pH, Arterial: 7.435 (ref 7.350–7.450)
pO2, Arterial: 391 mmHg — ABNORMAL HIGH (ref 80.0–100.0)
pO2, Arterial: 73 mmHg — ABNORMAL LOW (ref 80.0–100.0)

## 2010-05-27 LAB — CULTURE, BLOOD (ROUTINE X 2)

## 2010-05-27 LAB — PHOSPHORUS
Phosphorus: 4.6 mg/dL (ref 2.3–4.6)
Phosphorus: 6 mg/dL — ABNORMAL HIGH (ref 2.3–4.6)
Phosphorus: 6.8 mg/dL — ABNORMAL HIGH (ref 2.3–4.6)

## 2010-05-27 LAB — TSH: TSH: 1.867 u[IU]/mL (ref 0.350–4.500)

## 2010-05-27 LAB — GLUCOSE, CAPILLARY
Glucose-Capillary: 101 mg/dL — ABNORMAL HIGH (ref 70–99)
Glucose-Capillary: 107 mg/dL — ABNORMAL HIGH (ref 70–99)
Glucose-Capillary: 108 mg/dL — ABNORMAL HIGH (ref 70–99)
Glucose-Capillary: 108 mg/dL — ABNORMAL HIGH (ref 70–99)
Glucose-Capillary: 109 mg/dL — ABNORMAL HIGH (ref 70–99)
Glucose-Capillary: 110 mg/dL — ABNORMAL HIGH (ref 70–99)
Glucose-Capillary: 129 mg/dL — ABNORMAL HIGH (ref 70–99)
Glucose-Capillary: 134 mg/dL — ABNORMAL HIGH (ref 70–99)
Glucose-Capillary: 142 mg/dL — ABNORMAL HIGH (ref 70–99)
Glucose-Capillary: 143 mg/dL — ABNORMAL HIGH (ref 70–99)
Glucose-Capillary: 158 mg/dL — ABNORMAL HIGH (ref 70–99)
Glucose-Capillary: 163 mg/dL — ABNORMAL HIGH (ref 70–99)
Glucose-Capillary: 89 mg/dL (ref 70–99)

## 2010-05-27 LAB — POCT CARDIAC MARKERS
CKMB, poc: 3.4 ng/mL (ref 1.0–8.0)
Myoglobin, poc: >500 ng/mL (ref 12–200)
Troponin i, poc: 0.08 ng/mL (ref 0.00–0.09)

## 2010-05-27 LAB — CARDIAC PANEL(CRET KIN+CKTOT+MB+TROPI)
Relative Index: 2.1 (ref 0.0–2.5)
Troponin I: 0.27 ng/mL — ABNORMAL HIGH (ref 0.00–0.06)

## 2010-05-27 LAB — IMMUNOFIXATION ELECTROPHORESIS
IgA: 677 mg/dL — ABNORMAL HIGH (ref 68–378)
IgG (Immunoglobin G), Serum: 1000 mg/dL (ref 694–1618)

## 2010-05-27 LAB — CARBOXYHEMOGLOBIN
Carboxyhemoglobin: 1.3 % (ref 0.5–1.5)
Methemoglobin: 1 % (ref 0.0–1.5)
O2 Saturation: 69.5 %
Total hemoglobin: 13.7 g/dL (ref 13.5–18.0)

## 2010-05-27 LAB — CK: Total CK: 219 U/L (ref 7–232)

## 2010-05-27 LAB — BLOOD GAS, VENOUS
O2 Saturation: 57.6 %
Patient temperature: 98.6
TCO2: 33.5 mmol/L (ref 0–100)
pH, Ven: 7.265 (ref 7.250–7.300)

## 2010-05-27 LAB — BRAIN NATRIURETIC PEPTIDE
Pro B Natriuretic peptide (BNP): 216 pg/mL — ABNORMAL HIGH (ref 0.0–100.0)
Pro B Natriuretic peptide (BNP): 660 pg/mL — ABNORMAL HIGH (ref 0.0–100.0)

## 2010-05-27 LAB — CORTISOL: Cortisol, Plasma: 35.5 ug/dL

## 2010-05-27 LAB — APTT: aPTT: 32 seconds (ref 24–37)

## 2010-05-27 LAB — VANCOMYCIN, RANDOM: Vancomycin Rm: 16.8 ug/mL

## 2010-05-27 LAB — PROTIME-INR
INR: 1.21 (ref 0.00–1.49)
Prothrombin Time: 15.2 seconds (ref 11.6–15.2)

## 2010-05-27 LAB — MAGNESIUM: Magnesium: 1.8 mg/dL (ref 1.5–2.5)

## 2010-07-08 NOTE — Procedures (Signed)
DUPLEX DEEP VENOUS EXAM - LOWER EXTREMITY   INDICATION:  Follow up left GSV ablation.   HISTORY:  Edema:  Mostly left lower extremity.  Trauma/Surgery:  On 07/29/2009, left greater saphenous vein ablation.  Pain:  Left lower extremity.  PE:  No.  Previous DVT:  No.  Anticoagulants:  Other:   DUPLEX EXAM:                CFV   SFV   PopV  PTV    GSV                R  L  R  L  R  L  R   L  R  L  Thrombosis    o  o     o     o      o     +  Spontaneous   +  +     +     +      +     o  Phasic        +  +     +     +      +     o  Augmentation  +  +     +     +      +     o  Compressible  +  +     +     +      +     o  Competent     o  o     o     +      +     o   Legend:  + - yes  o - no  p - partial  D - decreased   IMPRESSION:  1. No evidence of deep venous thrombosis in the left lower extremity      or right common femoral vein.  2. Evidence of ablated left greater saphenous vein with no flow from      near the saphenofemoral junction to the distal thigh, distal to      which the greater saphenous vein is open with reflux.    _____________________________  Joseph Durham. Hart Rochester, M.D.   AS/MEDQ  D:  08/05/2009  T:  08/05/2009  Job:  [id number]

## 2010-07-08 NOTE — Assessment & Plan Note (Signed)
OFFICE VISIT   ASSER, LUCENA  DOB:  1950-07-23                                       06/10/2009  ZOXWR#:[id number]   The patient comes back today for followup.  I had initially evaluated  him for his diabetes.  He was found to have significant venous  insufficiency with large varicosities.  I put him in compression  stockings brought him back today for further evaluation as well as a  reflux study.  The patient states that his symptoms of heaviness and  fatigue and swelling are much improved with compression stockings.   PHYSICAL EXAMINATION:  Heart rate 104, blood pressure 109/64,  temperature is 97.8.  generally, he is well-appearing, in no distress.  Respirations are nonlabored.  Cardiovascular is regular rate and rhythm.  Extremities reveal minimal edema.  He has varicosities in the right  greater saphenous distribution bilaterally.   DIAGNOSTIC STUDIES:  Venous insufficiency evaluation was independently  reviewed.  This shows bilateral venous insufficiency.   ASSESSMENT/PLAN:  Bilateral venous insufficiency with varicosities,   PLAN:  The patient is having good response from compression therapy.  I  think he would be an excellent candidate for laser ablation and stab  phlebectomy.  The patient is going to discuss this with Dr. Newt Lukes at  the [facility] to evaluate him for insurance approval.  I am scheduling him to  come back and see Dr. Hart Rochester in 1 month to schedule his procedure.     Jorge Ny, MD  Electronically Signed   VWB/MEDQ  D:  06/10/2009  T:  06/11/2009  Job:  [id number]   cc:   [facility] Dr. Marny Lowenstein
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
## 2010-07-08 NOTE — Assessment & Plan Note (Signed)
OFFICE VISIT   Joseph Durham, Joseph Durham  DOB:  [birth year]                                       04/08/2009  ZOXWR#:[id number]   REASON FOR VISIT:  To evaluate arterial circulation.   HISTORY:  This is a 60 year old gentleman with multiple medical problems  whom I am seeing at the request of Dr. Al Corpus for evaluation of  peripheral vascular disease.  The patient is scheduled to undergo  biopsy/  debridement of his feet and with his known history of diabetes  and vascular disease, he was sent for further evaluation.  The patient  denies having any history of claudication.  He does not have any rest  pain.  He does have some neuropathic pain in his legs.  The patient  suffers from diabetes which began at age 89 requiring insulin.  He also  has hypertension and hypercholesterolemia which has been medically  managed and he has had heart attack and suffers from congestive heart  failure.  He has been having trouble with his legs for the past 10 years  and it has been attributed to peripheral vascular disease.   REVIEW OF SYSTEMS:  Positive for weight loss, chest pain, shortness of  breath/ coughing, constipation, frequent urination, dizziness, arthritis  and recent change in eyesight.  All other review of systems are negative  as documented in the encounter form.   PAST MEDICAL HISTORY:  Hypertension, hypercholesterolemia, history of an  MI, congestive heart failure, diabetes.   PAST SURGICAL HISTORY:  Left ankle surgery.   FAMILY HISTORY:  Negative for cardiovascular disease at an early age.   SOCIAL HISTORY:  He is single with 6 children.  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  He does not smoke and does not drink.   PHYSICAL EXAMINATION:  Heart rate 108, blood pressure 113/72,  temperature is 98.6.  General:  He is well-appearing in no distress.  HEENT:  Within normal limits.  Lungs:  Clear bilaterally.  Cardiovascular:  Regular rate and rhythm.  No  carotid bruits.  Pedal  pulses not palpable.  Abdomen:  Obese but soft.  Musculoskeletal:  Without major deformities.  Neurologic:  He denies focal weaknesses.  Skin/ extremities:  The patient has hyperpigmented bilateral lower  extremities with edema from the ankle to the knee.  He also has  prominent varicosities in the greater saphenous region bilaterally.   Ultrasound was performed today which has been independently reviewed.  ABI on the right is 1.0.  Toe pressure is 68.  On the left knee, ABI is  0.78.  Toe pressure is 50.   ASSESSMENT/PLAN:  I think most of the patient's lower extremity problems  are associated with venous reflux.  I have elected to place him in thigh-  high compression stockings and have him come back in 2 months for a  venous insufficiency ultrasound.  If he does indeed have superficial  reflux, he would be a candidate for laser ablation given the severity of  his disease.   The patient also has arterial insufficiency, the left side is worse than  the right.  Most of this appears to be small vessel disease as the focus  of his disease is within the small arteries within the feet.  If he has  to undergo biopsy or intervention,  it would be safer  to proceed on the  right leg as toe pressure is 68 here.  Obviously, given his longstanding  history of diabetes, he is at high risk for anything done to his feet.   I will see him back in 2 months.     Jorge Ny, MD  Electronically Signed   VWB/MEDQ  D:  04/08/2009  T:  04/09/2009  Job:  [id number]   cc:   Max T. Hyatt, D.P.M.

## 2010-07-08 NOTE — Procedures (Signed)
DUPLEX DEEP VENOUS EXAM - LOWER EXTREMITY   INDICATION:  Follow up right greater saphenous vein laser ablation.   HISTORY:  Edema:  No.  Trauma/Surgery:  Left greater saphenous vein laser ablation on  07/29/2009, right greater saphenous vein laser ablation on 09/02/2009.  Pain:  Right medial thigh tenderness.  PE:  No.  Previous DVT:  No.  Anticoagulants:  Other:   DUPLEX EXAM:                CFV   SFV   PopV  PTV    GSV                R  L  R  L  R  L  R   L  R  L  Thrombosis    o  o  o     o     o      +  Spontaneous   +  +  +     +     +      o  Phasic        +  +  +     +     +      o  Augmentation  +  +  +     +     +      o  Compressible  +  +  +     +     +      o  Competent     o  o  o     o     o      o   Legend:  + - yes  o - no  p - partial  D - decreased   IMPRESSION:  1. No evidence of deep venous thrombosis noted in the right lower      extremity.  2. The right greater saphenous vein appears occluded from the distal      insertion site to the groin area.  3. Reflux noted in the right saphenofemoral junction and extending      into the lateral branch of the greater saphenous vein.  4. Reflux also noted throughout the right lower extremity deep venous      system.       _____________________________  Quita Skye Hart Rochester, M.D.   CH/MEDQ  D:  09/09/2009  T:  09/09/2009  Job:  [id number]

## 2010-07-08 NOTE — Assessment & Plan Note (Signed)
OFFICE VISIT   Joseph Durham, Joseph Durham  DOB:  [birth year]                                       09/02/2009  ZOXWR#:[id number]   The patient today had laser ablation of his right great saphenous vein  for valvular incompetence and venous hypertension with distal edema.  He  tolerated the procedure well.  He will return in 1 week for venous  duplex exam to confirm closure of the right great saphenous vein.     Quita Skye Hart Rochester, M.D.  Electronically Signed   JDL/MEDQ  D:  09/02/2009  T:  09/03/2009  Job:  [id number]

## 2010-07-08 NOTE — Assessment & Plan Note (Signed)
OFFICE VISIT   MUMIN, DENOMME  DOB:  08/19/1950                                       09/09/2009  UEAVW#:[id number]   The patient returns 1 week post laser ablation of his right great  saphenous vein for severe venous insufficiency of the right leg.  He has  had chronic edema and thickening of the skin but no history of venous  stasis ulcers.  He says his leg feels much better, although he has had  some aching discomfort in the thigh over the laser ablation site.  He  states that the left leg feels very good compared to what it was like  prior to the procedure.  He is wearing bilateral long-leg elastic  compression stockings.  He denied any chest pain, dyspnea on exertion,  other than what he was experiencing prior to the procedure.   PHYSICAL EXAMINATION:  Blood pressure 117/75, heart rate 88,  respirations 16.  Both legs have decreased edema from prior to the  procedure.  He does have some bulging varicosities below the knee which  are less tense than before on the left.  No varicosities noted on the  right.  His thickening of the skin is stable.  He has palpable pedal  pulses bilaterally.   His reflux exam today was ordered by me and reviewed and interpreted.  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  Deep system is widely patent.  He does have a lateral branch of his right great saphenous vein which  has some reflux up to the saphenofemoral junction.   He will return to see [place] on a p.r.n. basis.     Quita Skye Hart [place], M.D.  Electronically Signed   JDL/MEDQ  D:  09/09/2009  T:  09/10/2009  Job:  [id number]

## 2010-07-08 NOTE — Assessment & Plan Note (Signed)
OFFICE VISIT   CHASETON, YEPIZ  DOB:  [birth year]                                       08/05/2009  ZOXWR#:[id number]   Mr. Treanor returns 1 week post laser ablation of his left great  saphenous vein for severe venous insufficiency.  He has severe reflux in  the deep system as well as the superficial system and severe skin  changes of chronic venous hypertension and pain as well as bulging  varicosities below the knee.  He has failed conservative management.  He  had some moderate discomfort in the thigh following the laser ablation  procedure which has improved significantly over the last few days.  He  has noted significant decrease in the bulging of the varicosities below  the knee.  Edema slightly improved in the left ankle.  He did take  ibuprofen as instructed.  He continues to have bulging varicosities in  the right leg and these cause aching and throbbing discomfort despite  the use of long-leg stocking on the right side.   On examination today, blood pressure 134/85, heart rate 94, respirations  24.  GENERAL:  He is well-developed, well-nourished male in no apparent  distress, alert and oriented x3.  Lower extremity exam reveals 3+ femoral and dorsalis pedis pulse.  These  bulging varicosities below the knee on the left much less tense than  prior his procedure.  He has some mild tenderness along the course of  the greater saphenous vein.   Today I ordered venous duplex exam of the left leg which reveals closure  of the left great saphenous vein from the saphenofemoral junction to the  distal thigh with patency below that where the varicosities are noted.  Deep system has no evidence of DVT.  I have reassured him regarding  these findings and scheduled him for a similar procedure on the right  side to be done in the near future.     Quita Skye Hart [place], M.D.  Electronically Signed   JDL/MEDQ  D:  08/05/2009  T:  08/06/2009  Job:  [id number]

## 2010-07-08 NOTE — Assessment & Plan Note (Signed)
OFFICE VISIT   MELODY, SAVIDGE  DOB:  [birth year]                                       07/29/2009  ZOXWR#:[id number]   The patient underwent laser ablation of his left great saphenous vein  for severe reflux and valvular incompetence with painful varicosities in  the left thigh and calf.  He tolerated the procedure well.  Will return  in 1 week for a venous duplex exam to confirm closure of the left great  saphenous vein.  He also needs a similar procedure performed on the  right side.     Quita Skye Hart [place], M.D.  Electronically Signed   JDL/MEDQ  D:  07/29/2009  T:  07/29/2009  Job:  [id number]

## 2010-07-08 NOTE — Procedures (Signed)
LOWER EXTREMITY VENOUS REFLUX EXAM   INDICATION:  Bilateral lower extremity pain and swelling with varicose  veins.   EXAM:  Using color-flow imaging and pulse Doppler spectral analysis, the  right and left common femoral, superficial femoral, popliteal, posterior  tibial, greater and lesser saphenous veins are evaluated.  There is  evidence suggesting deep venous insufficiency in the right and left  lower extremities.   The right and left saphenofemoral junctions are not competent with  Reflux of >559milliseconds. The right and left GSV are not competent  with Reflux of >573milliseconds with the caliber as described below, as  well as the right lateral branch.   The right and left proximal short saphenous vein demonstrate competency.   GSV Diameter (used if found to be incompetent only)                                            Right         Left  Proximal Greater Saphenous Vein           0.45 cm       0.76 cm  Proximal-to-mid-thigh                     cm            cm  Mid thigh                                 0.35 cm       0.62 cm  Mid-distal thigh                          cm            cm  Distal thigh                              0.39 cm       0.74 cm  Knee                                      0.45 cm       0.47 cm   IMPRESSION:  1. Right greater saphenous vein Reflux with >558milliseconds is      identified with the caliber ranging from 0.35 cm to 0.45 cm knee to      groin.  The left from 0.47 cm to 0.76 cm.  2. The right and left greater saphenous veins are not aneurysmal.  3. The right and left greater saphenous veins are not tortuous.  4. The deep venous system is not competent with Reflux of      >52milliseconds.  5. The right and left lesser saphenous veins are competent.      ___________________________________________  V. Charlena Cross, MD   AS/MEDQ  D:  06/10/2009  T:  06/10/2009  Job:  [id number]

## 2010-08-01 IMAGING — CR DG CHEST 1V PORT
1 series · 1 of 1 positions shown · non-contrast
Comparison: Film earlier this day

CLINICAL DATA: Central venous catheter placement.

PORTABLE CHEST - 1 VIEW

[AP]
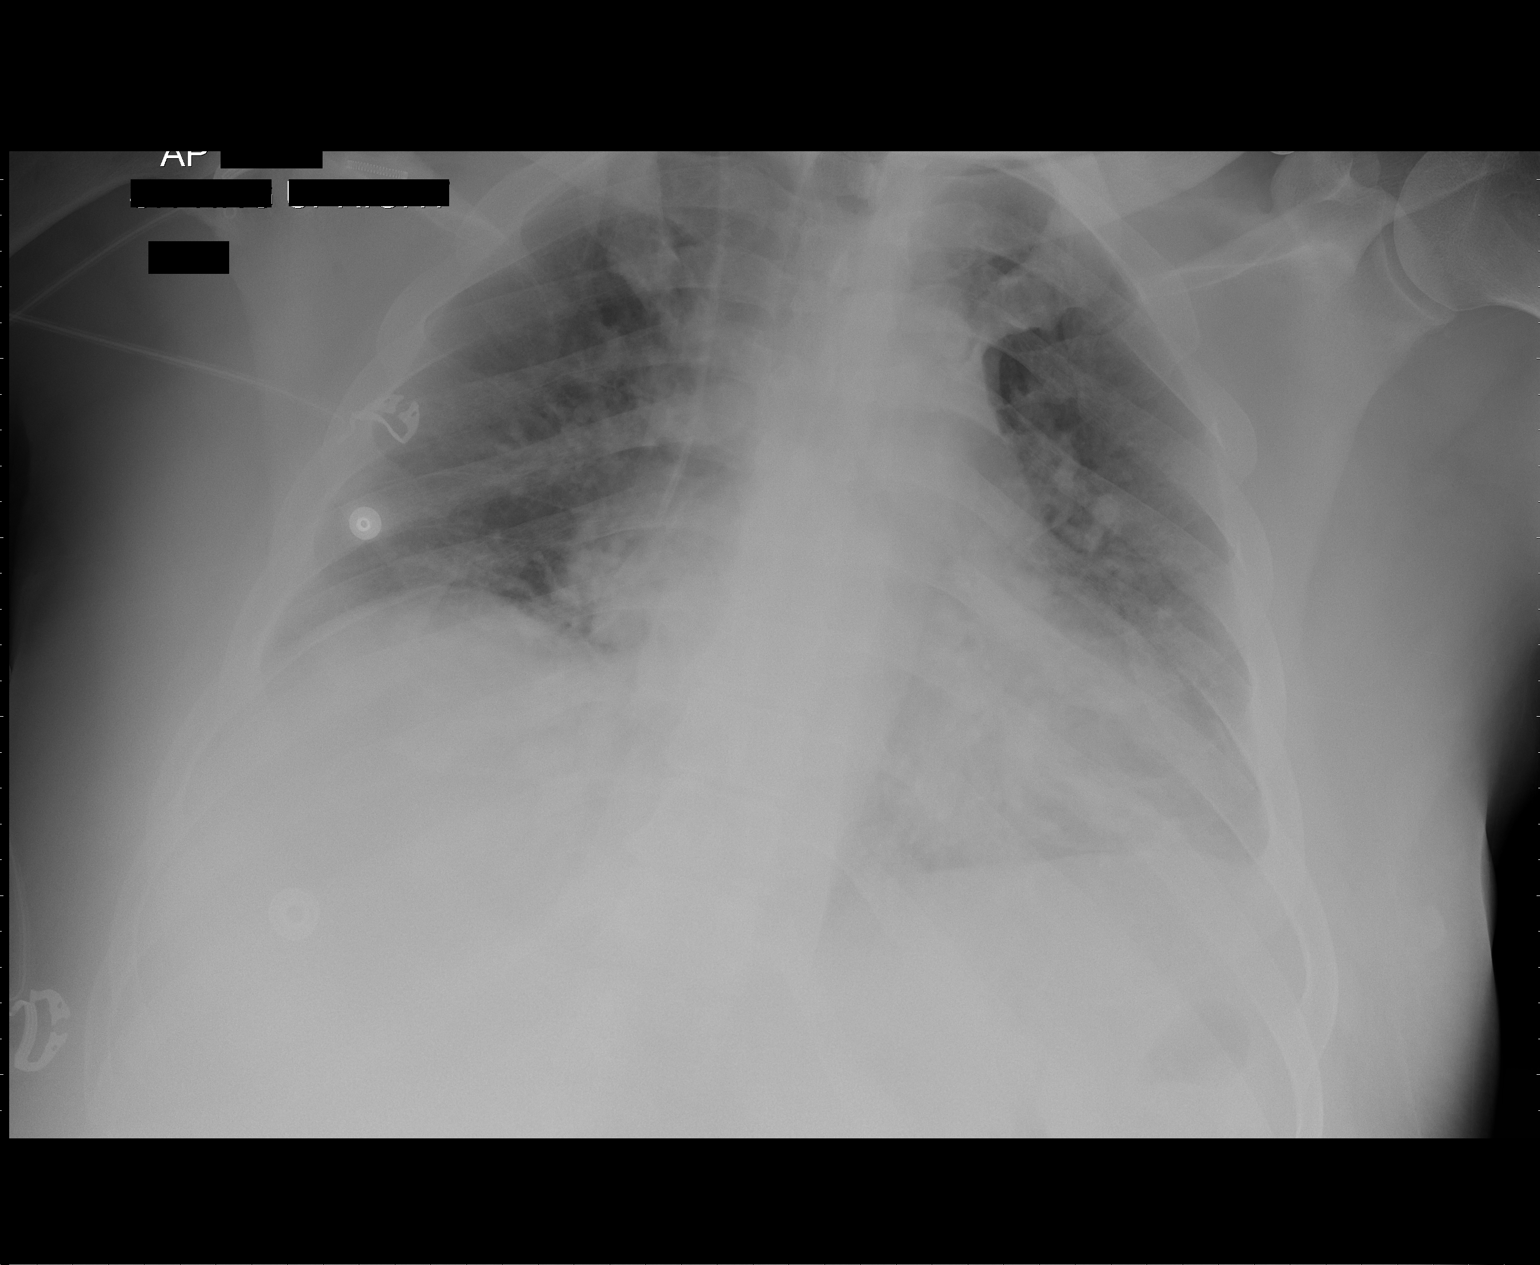

[1 of 1 positions shown; findings below may reference images not displayed]

FINDINGS: A right IJ central venous catheter has been placed with
tip overlying the lower SVC.
Cardiomegaly, pulmonary vascular congestion, and interstitial
pulmonary edema are again noted.
Mild bibasilar atelectasis and elevation of the right hemidiaphragm
are again noted.
There is no evidence of pneumothorax.
IMPRESSION: Right IJ central venous catheter placement with tip overlying the
lower SVC.  No evidence of pneumothorax.

Stable cardiomegaly, interstitial edema and bibasilar atelectasis.

## 2010-08-05 IMAGING — CR DG CHEST 1V PORT
1 series · 1 of 1 positions shown · non-contrast
Comparison: 02/06/2009

CLINICAL DATA: Respiratory distress.

PORTABLE CHEST - 1 VIEW

[AP]
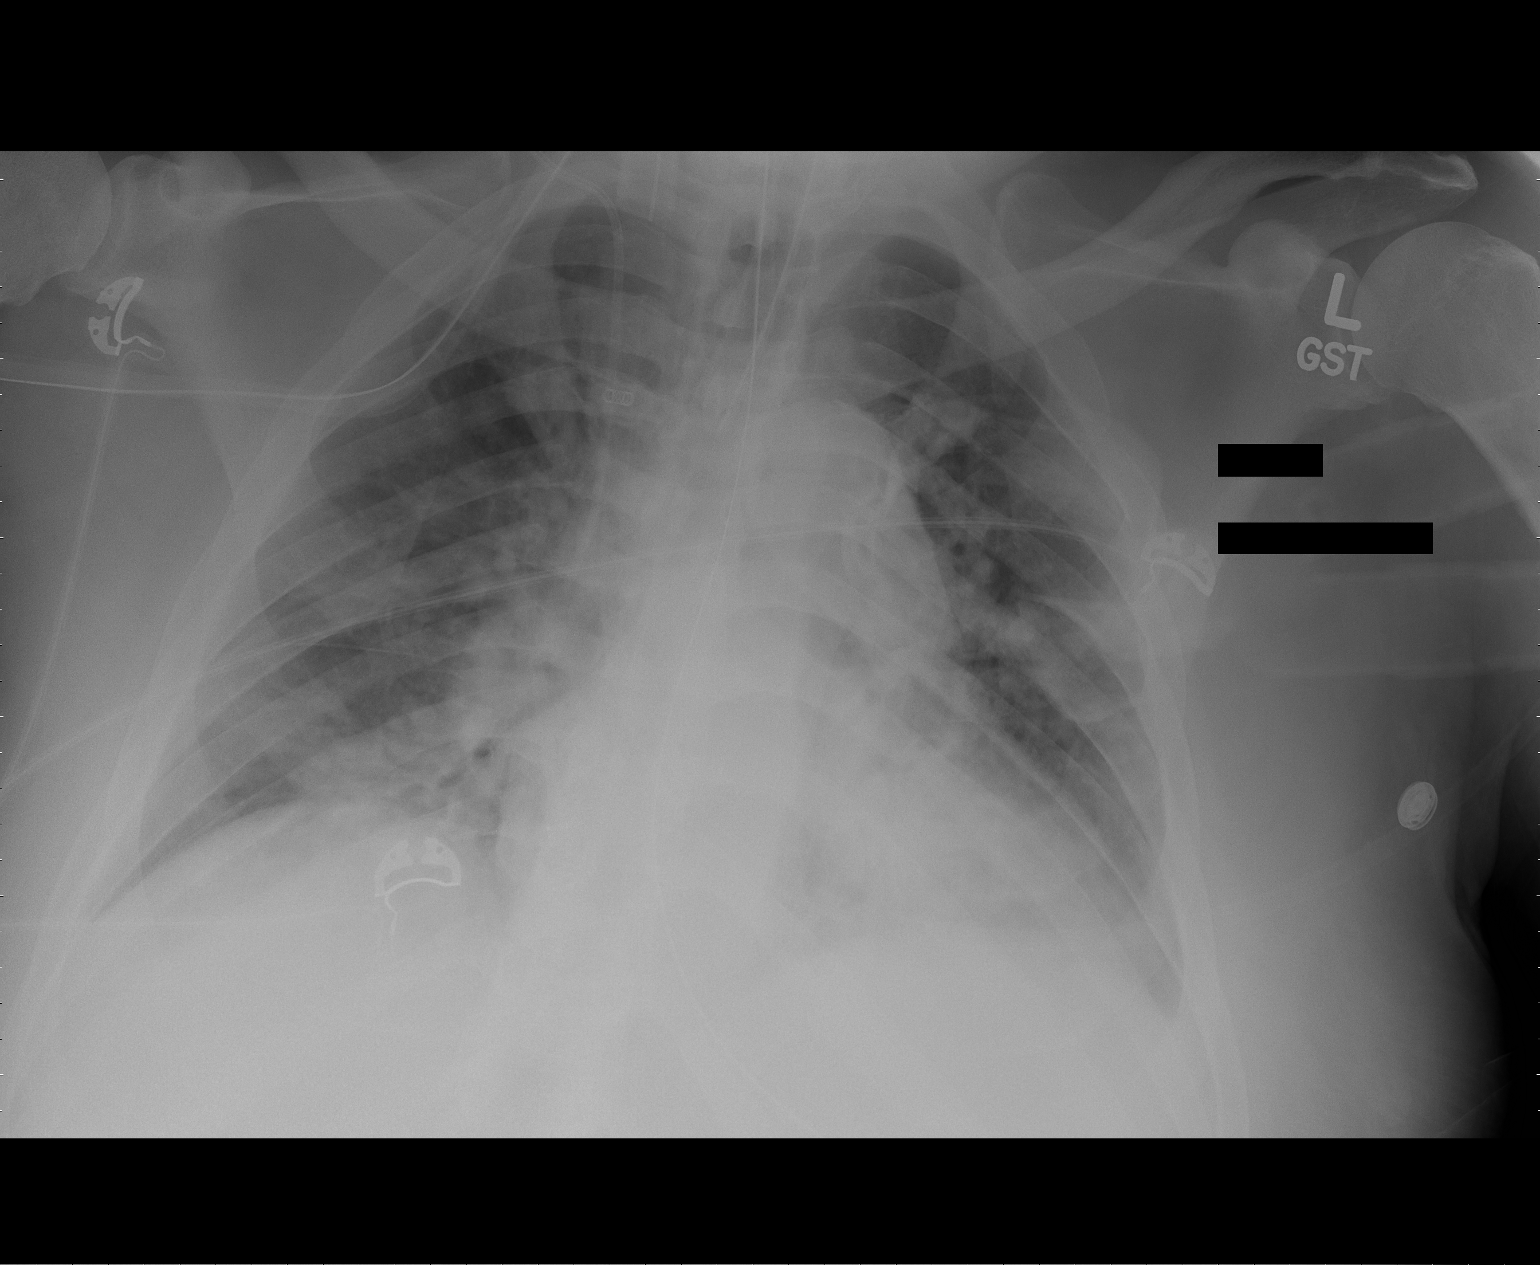

[1 of 1 positions shown; findings below may reference images not displayed]

FINDINGS: The support apparatus is stable.  No significant change
and bilateral airspace process and probable left effusion.  No
pneumothorax.
IMPRESSION: 1.  Stable support apparatus.
2.  Persistent asymmetric airspace process and small left effusion.

## 2017-03-13 ENCOUNTER — Other Ambulatory Visit: Payer: Self-pay

## 2017-03-13 ENCOUNTER — Encounter (HOSPITAL_COMMUNITY): Payer: Self-pay | Admitting: Neurology

## 2017-03-13 ENCOUNTER — Emergency Department (HOSPITAL_COMMUNITY)
Admission: EM | Admit: 2017-03-13 | Discharge: 2017-03-26 | Disposition: E | Payer: Medicare Other | Attending: Emergency Medicine | Admitting: Emergency Medicine

## 2017-03-13 DIAGNOSIS — I5022 Chronic systolic (congestive) heart failure: Secondary | ICD-10-CM | POA: Diagnosis not present

## 2017-03-13 DIAGNOSIS — J9620 Acute and chronic respiratory failure, unspecified whether with hypoxia or hypercapnia: Secondary | ICD-10-CM

## 2017-03-13 DIAGNOSIS — J449 Chronic obstructive pulmonary disease, unspecified: Secondary | ICD-10-CM | POA: Diagnosis not present

## 2017-03-13 DIAGNOSIS — I468 Cardiac arrest due to other underlying condition: Secondary | ICD-10-CM | POA: Diagnosis present

## 2017-03-13 DIAGNOSIS — I2510 Atherosclerotic heart disease of native coronary artery without angina pectoris: Secondary | ICD-10-CM | POA: Insufficient documentation

## 2017-03-13 DIAGNOSIS — I110 Hypertensive heart disease with heart failure: Secondary | ICD-10-CM | POA: Insufficient documentation

## 2017-03-13 DIAGNOSIS — I469 Cardiac arrest, cause unspecified: Secondary | ICD-10-CM

## 2017-03-13 HISTORY — DX: Chronic obstructive pulmonary disease, unspecified: J44.9

## 2017-03-13 MED ORDER — EPINEPHRINE PF 1 MG/10ML IJ SOSY
PREFILLED_SYRINGE | INTRAMUSCULAR | Status: AC | PRN
  Administered 2017-03-13 (×2): 1 mg via INTRAVENOUS

## 2017-03-14 MED FILL — Medication: Qty: 1 | Status: AC

## 2017-03-26 NOTE — ED Provider Notes (Signed)
[facility] EMERGENCY DEPARTMENT Provider Note   CSN: [id number] Arrival date & time: 04-10-17  0827   LEVEL 5 CAVEAT - CPR  History   Chief Complaint No chief complaint on file.   HPI Joseph Durham is a 67 y.o. male.  HPI  67 year old male with a history of COPD, heart failure, hypertension, and coronary disease presents in CPR.  History is taken from EMS.  Patient called out for sudden shortness of breath this morning.  Was given a breathing treatment by the fire department and while patient was being transferred to stretcher, he coded.  Since then, he has had a couple episodes of ventricular fibrillation that have been shocked.  He has been given for epinephrines.  By the time he has come here, he has had CPR for at least 30 minutes.  No past medical history on file.  Patient Active Problem List   Diagnosis Date Noted  . HYPERLIPIDEMIA-MIXED 04/08/2009  . HYPERTENSION, BENIGN 04/08/2009  . CAD, NATIVE VESSEL 04/08/2009  . SYSTOLIC HEART FAILURE, CHRONIC 04/08/2009         Home Medications    Prior to Admission medications   Not on File    Family History No family history on file.  Social History Social History   Tobacco Use  . Smoking status: Not on file  Substance Use Topics  . Alcohol use: Not on file  . Drug use: Not on file     Allergies   Patient has no allergy information on record.   Review of Systems Review of Systems  Unable to perform ROS: Patient unresponsive     Physical Exam Updated Vital Signs There were no vitals taken for this visit.  Physical Exam  Constitutional: He appears well-developed and well-nourished. He is intubated.  HENT:  Head: Normocephalic and atraumatic.  Right Ear: External ear normal.  Left Ear: External ear normal.  Nose: Nose normal.  Eyes: Right eye exhibits no discharge. Left eye exhibits no discharge.  Pupils mid-sized, no reaction  Neck: Neck supple.  Pulmonary/Chest: Breath  sounds normal. He is intubated.  King airway in place, being bagged  Abdominal: Soft. He exhibits distension.  Musculoskeletal:  IO in place  Neurological: He is unresponsive. GCS eye subscore is 1. GCS verbal subscore is 1. GCS motor subscore is 1.  Skin: Skin is warm and dry.  Nursing note and vitals reviewed.    ED Treatments / Results  Labs (all labs ordered are listed, but only abnormal results are displayed) Labs Reviewed - No data to display  EKG  EKG Interpretation None       Radiology No results found.  Procedures Procedures (including critical care time)  Cardiopulmonary Resuscitation (CPR) Procedure Note Directed/Performed by: Audree Camel I personally directed ancillary staff and/or performed CPR in an effort to regain return of spontaneous circulation and to maintain cardiac, neuro and systemic perfusion.    Medications Ordered in ED Medications  EPINEPHrine (ADRENALIN) 1 MG/10ML injection (1 mg Intravenous Given 04-10-17 0841)     Initial Impression / Assessment and Plan / ED Course  I have reviewed the triage vital signs and the nursing notes.  Pertinent labs & imaging results that were available during my care of the patient were reviewed by me and considered in my medical decision making (see chart for details).     Patient presents in cardiac arrest.  Likely stemming from respiratory arrest given his shortness of breath that decompensated.  However despite having  good ventilation with King airway and good lung sounds through this, we have not been able to have a return of spontaneous circulation.  He has been having CPR for over 30 minutes prior to his arrival in the ED.  With further CPR and epinephrine's, still no return of spontaneous circulation.  Bedside ultrasound by me shows no cardiac activity.  He is in PEA but with no femoral pulse or dopplerable pulse, his survival likelihood is extremely low and the code was called.  Does not seem to be a  medical examiner case.  Family does not want an autopsy.  I did discuss his untimely demise with family and they understand.  Chaplain present to help assist with further care.  Final Clinical Impressions(s) / ED Diagnoses   Final diagnoses:  Cardiac arrest (HCC)  Acute on chronic respiratory failure, unspecified whether with hypoxia or hypercapnia [facility](HCC)    ED Discharge Orders    None       Pricilla LovelessGoldston, Cyara Devoto, MD Jul 03, 2017 1139

## 2017-03-26 NOTE — Code Documentation (Signed)
Dr. Criss AlvineGoldston using US to assess cardiac activity. None seen.

## 2017-03-26 NOTE — ED Triage Notes (Signed)
Per ems- Called out to home for severe respiratory distress. Fire department administered albuterol. He then had a cardiac arrest at [id number]. CPR started, king airway. Given 4 EPIs, DEFIB x 2 for VFIB. CBG 460. Given total of 10 mg albuterol, 0.5 atrovent. Has left IO. On lucas for CPR upon arrival.

## 2017-03-26 NOTE — Progress Notes (Signed)
   29-Aug-2017 1100  Clinical Encounter Type  Visited With Family  Visit Type Death  Referral From Nurse  Consult/Referral To Chaplain  Spiritual Encounters  Spiritual Needs Prayer;Emotional;Grief support  Chaplain was with the family from 8:35 to 9:45 AM as PT was passing away.  Chaplain prayed with the family and allowed them time to grieve while visiting the deceased.  Chaplain provided the patient placement card to family and got next of Kin contact info for the medical record.

## 2017-03-26 NOTE — ED Notes (Signed)
Patient placement reports post-mortem is complete. Family has gone, and took belonging bags. Family unsure of funeral home, given pt placement card to call with decision is made.

## 2017-03-26 NOTE — Code Documentation (Signed)
Dr. Criss AlvineGoldston using US. No cardiac activity. TOD [id number].

## 2017-03-26 DEATH — deceased
